# Patient Record
Sex: Female | Born: 1995 | Race: White | Hispanic: No | Marital: Single | Smoking: Current every day smoker
Health system: Southern US, Community
[De-identification: ages and names within clinical notes are randomized; demographics above are authoritative.]

## PROBLEM LIST (undated history)

## (undated) DIAGNOSIS — F988 Other specified behavioral and emotional disorders with onset usually occurring in childhood and adolescence: Secondary | ICD-10-CM

---
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
## 2017-05-26 ENCOUNTER — Other Ambulatory Visit: Payer: Self-pay

## 2017-05-26 ENCOUNTER — Encounter (HOSPITAL_COMMUNITY): Payer: Self-pay | Admitting: Emergency Medicine

## 2017-05-26 ENCOUNTER — Emergency Department (HOSPITAL_COMMUNITY)
Admission: EM | Admit: 2017-05-26 | Discharge: 2017-05-26 | Disposition: A | Discharge status: Home / Self Care | Payer: Medicaid Other | Attending: Emergency Medicine | Admitting: Emergency Medicine

## 2017-05-26 DIAGNOSIS — J028 Acute pharyngitis due to other specified organisms: Secondary | ICD-10-CM

## 2017-05-26 DIAGNOSIS — B9789 Other viral agents as the cause of diseases classified elsewhere: Secondary | ICD-10-CM | POA: Diagnosis not present

## 2017-05-26 DIAGNOSIS — J029 Acute pharyngitis, unspecified: Secondary | ICD-10-CM | POA: Diagnosis present

## 2017-05-26 DIAGNOSIS — Z79899 Other long term (current) drug therapy: Secondary | ICD-10-CM | POA: Diagnosis not present

## 2017-05-26 DIAGNOSIS — F900 Attention-deficit hyperactivity disorder, predominantly inattentive type: Secondary | ICD-10-CM | POA: Insufficient documentation

## 2017-05-26 DIAGNOSIS — F17290 Nicotine dependence, other tobacco product, uncomplicated: Secondary | ICD-10-CM | POA: Diagnosis not present

## 2017-05-26 LAB — GROUP A STREP BY PCR: Group A Strep by PCR: NOT DETECTED

## 2017-05-26 MED ORDER — ACETAMINOPHEN-CODEINE 120-12 MG/5ML PO SOLN: 5.0000 mL | ORAL | 0 refills | Status: AC | PRN

## 2017-05-26 MED ORDER — GUAIFENESIN ER 1200 MG PO TB12: 1.0000 | ORAL_TABLET | Freq: Two times a day (BID) | ORAL | 0 refills | Status: AC

## 2017-05-26 MED ORDER — IBUPROFEN 800 MG PO TABS: 800.0000 mg | ORAL_TABLET | Freq: Three times a day (TID) | ORAL | 0 refills | Status: AC | PRN

## 2017-05-26 NOTE — ED Triage Notes (Signed)
Pt reports sore throat since yesterday. symptoms began as scratching she used warm salt water gargle and antihistamine. NAD.

## 2017-05-26 NOTE — ED Notes (Signed)
Pt verbalized understanding of discharge instructions and denies any further questions at this time.   

## 2017-05-26 NOTE — Discharge Instructions (Addendum)
Return here as needed.  Your strep test is negative.  Increase your fluid intake and rest as much as possible.

## 2017-05-28 NOTE — ED Provider Notes (Signed)
[facility] EMERGENCY DEPARTMENT Provider Note   CSN: [id number] Arrival date & time: 05/26/17  1107     History   Chief Complaint Chief Complaint  Patient presents with  . Sore Throat    HPI Rebekah Ross is a 22 y.o. female.  HPI Patient presents to the emergency department with sore throat and cough over the last 24 hours.  The patient states that she was concerned she may have a bacterial infection in her throat.  The patient states she did gargle with salt water at home.  She states no other medications or therapies were tried.  Patient states nothing seems to make the condition better or worse.  Patient denies chest pain, shortness of breath, nausea, vomiting, weakness, fever, dizziness, head, blurred vision, throat swelling, difficulty swallowing or syncope. Past Medical History:  Diagnosis Date  . ADD (attention deficit disorder)     Patient Active Problem List   Diagnosis Date Noted  . 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 ABDOMINAL PAIN, LEFT LOWER QUADRANT 04/25/2006  . ATTENTION DEFICIT, W/O HYPERACTIVITY 03/09/2006    History reviewed. No pertinent surgical history.   OB History   None      Home Medications    Prior to Admission medications   Medication Sig Start Date End Date Taking? Authorizing Provider  acetaminophen-codeine 120-12 MG/5ML solution Take 5 mLs by mouth every 4 (four) hours as needed for moderate pain. 05/26/17   Muslima Toppins, Cristal Deer, PA-C  escitalopram (LEXAPRO) 10 MG tablet Take 1 tablet at bedtime by mouth. 10/28/16   [provider]  Guaifenesin 1200 MG TB12 Take 1 tablet (1,200 mg total) by mouth 2 (two) times daily. 05/26/17   Nathalia Wismer, Cristal Deer, PA-C  hydrOXYzine (VISTARIL) 25 MG capsule Take 1 capsule at bedtime by mouth. 10/12/16   [provider]  ibuprofen (ADVIL,MOTRIN) 800 MG tablet Take 1 tablet (800 mg total) by mouth every 8 (eight) hours as needed. 05/26/17   Nataya Bastedo, Cristal Deer, PA-C    predniSONE (DELTASONE) 10 MG tablet Take 1 tablet as directed by mouth. 11/15/16   [provider]    Family History No family history on file.  Social History Social History   Tobacco Use  . Smoking status: Current Every Day Smoker    Types: E-cigarettes  . Smokeless tobacco: Never Used  . Tobacco comment: Vapors  Substance Use Topics  . Alcohol use: No  . Drug use: No     Allergies   Patient has no known allergies.   Review of Systems Review of Systems  All other systems negative except as documented in the HPI. All pertinent positives and negatives as reviewed in the HPI. Physical Exam Updated Vital Signs BP 118/81 (BP Location: Right Arm)   Pulse 77   Temp 98.2 F (36.8 C) (Oral)   Resp 20   LMP 05/09/2017   SpO2 100%   Physical Exam  Constitutional: She is oriented to person, place, and time. She appears well-developed and well-nourished. No distress.  HENT:  Head: Normocephalic and atraumatic.  Mouth/Throat: Uvula is midline. Posterior oropharyngeal erythema present. No oropharyngeal exudate or posterior oropharyngeal edema. Tonsils are 1+ on the right. Tonsils are 1+ on the left.  Eyes: Pupils are equal, round, and reactive to light.  Neck: Normal range of motion. Neck supple.  Cardiovascular: Normal rate, regular rhythm and normal heart sounds. Exam reveals no gallop and no friction rub.  No murmur heard. Pulmonary/Chest: Effort normal and breath sounds normal. No respiratory distress. She  has no wheezes.  Abdominal: Soft. Bowel sounds are normal. She exhibits no distension. There is no tenderness.  Neurological: She is alert and oriented to person, place, and time. She exhibits normal muscle tone. Coordination normal.  Skin: Skin is warm and dry. Capillary refill takes less than 2 seconds. No rash noted. No erythema.  Psychiatric: She has a normal mood and affect. Her behavior is normal.  Nursing note and vitals reviewed.    ED Treatments /  Results  Labs (all labs ordered are listed, but only abnormal results are displayed) Labs Reviewed  GROUP A STREP BY PCR    EKG None  Radiology No results found.  Procedures Procedures (including critical care time)  Medications Ordered in ED Medications - No data to display   Initial Impression / Assessment and Plan / ED Course  I have reviewed the triage vital signs and the nursing notes.  Pertinent labs & imaging results that were available during my care of the patient were reviewed by me and considered in my medical decision making (see chart for details).     Will be treated for viral URI with cough and pharyngitis.  Told return here as needed.  Told to increase her fluid intake and rest as much as possible.  Final Clinical Impressions(s) / ED Diagnoses   Final diagnoses:  Pharyngitis due to other organism    ED Discharge Orders        Ordered    ibuprofen (ADVIL,MOTRIN) 800 MG tablet  Every 8 hours PRN     05/26/17 1342    Guaifenesin 1200 MG TB12  2 times daily     05/26/17 1342    acetaminophen-codeine 120-12 MG/5ML solution  Every 4 hours PRN     05/26/17 1342       Hether Anselmo, [place], PA-C 05/28/17 1635    Shaune Pollack, MD 05/28/17 1815

## 2019-06-26 ENCOUNTER — Emergency Department (HOSPITAL_COMMUNITY)
Admission: EM | Admit: 2019-06-26 | Discharge: 2019-06-26 | Disposition: A | Discharge status: Home / Self Care | Payer: Self-pay | Attending: Emergency Medicine | Admitting: Emergency Medicine

## 2019-06-26 ENCOUNTER — Other Ambulatory Visit: Payer: Self-pay

## 2019-06-26 ENCOUNTER — Emergency Department (HOSPITAL_COMMUNITY): Payer: Self-pay

## 2019-06-26 ENCOUNTER — Encounter (HOSPITAL_COMMUNITY): Payer: Self-pay

## 2019-06-26 DIAGNOSIS — R1011 Right upper quadrant pain: Secondary | ICD-10-CM | POA: Insufficient documentation

## 2019-06-26 DIAGNOSIS — R110 Nausea: Secondary | ICD-10-CM | POA: Insufficient documentation

## 2019-06-26 DIAGNOSIS — R630 Anorexia: Secondary | ICD-10-CM | POA: Insufficient documentation

## 2019-06-26 DIAGNOSIS — F909 Attention-deficit hyperactivity disorder, unspecified type: Secondary | ICD-10-CM | POA: Insufficient documentation

## 2019-06-26 DIAGNOSIS — F17200 Nicotine dependence, unspecified, uncomplicated: Secondary | ICD-10-CM | POA: Insufficient documentation

## 2019-06-26 DIAGNOSIS — Z79899 Other long term (current) drug therapy: Secondary | ICD-10-CM | POA: Insufficient documentation

## 2019-06-26 DIAGNOSIS — R1013 Epigastric pain: Secondary | ICD-10-CM | POA: Insufficient documentation

## 2019-06-26 LAB — COMPREHENSIVE METABOLIC PANEL
ALT: 14 U/L (ref 0–44)
AST: 19 U/L (ref 15–41)
Albumin: 3.9 g/dL (ref 3.5–5.0)
Alkaline Phosphatase: 71 U/L (ref 38–126)
Anion gap: 7 (ref 5–15)
BUN: 11 mg/dL (ref 6–20)
CO2: 26 mmol/L (ref 22–32)
Calcium: 9.2 mg/dL (ref 8.9–10.3)
Chloride: 103 mmol/L (ref 98–111)
Creatinine, Ser: 0.82 mg/dL (ref 0.44–1.00)
GFR calc Af Amer: >60 mL/min (ref >60)
GFR calc non Af Amer: >60 mL/min (ref >60)
Glucose, Bld: 112 mg/dL — ABNORMAL HIGH (ref 70–99)
Potassium: 3.7 mmol/L (ref 3.5–5.1)
Sodium: 136 mmol/L (ref 135–145)
Total Bilirubin: 0.5 mg/dL (ref 0.3–1.2)
Total Protein: 6.8 g/dL (ref 6.5–8.1)

## 2019-06-26 LAB — URINALYSIS, ROUTINE W REFLEX MICROSCOPIC
Bilirubin Urine: NEGATIVE
Glucose, UA: NEGATIVE mg/dL
Hgb urine dipstick: NEGATIVE
Ketones, ur: NEGATIVE mg/dL
Leukocytes,Ua: NEGATIVE
Nitrite: NEGATIVE
Protein, ur: NEGATIVE mg/dL
Specific Gravity, Urine: 1.019 (ref 1.005–1.030)
pH: 8 (ref 5.0–8.0)

## 2019-06-26 LAB — I-STAT BETA HCG BLOOD, ED (MC, WL, AP ONLY): I-stat hCG, quantitative: <5 m[IU]/mL (ref <5)

## 2019-06-26 LAB — CBC
HCT: 37.8 % (ref 36.0–46.0)
Hemoglobin: 12.6 g/dL (ref 12.0–15.0)
MCH: 30.1 pg (ref 26.0–34.0)
MCHC: 33.3 g/dL (ref 30.0–36.0)
MCV: 90.4 fL (ref 80.0–100.0)
Platelets: 360 10*3/uL (ref 150–400)
RBC: 4.18 MIL/uL (ref 3.87–5.11)
RDW: 12.5 % (ref 11.5–15.5)
WBC: 11.7 10*3/uL — ABNORMAL HIGH (ref 4.0–10.5)
nRBC: 0 % (ref 0.0–0.2)

## 2019-06-26 LAB — LIPASE, BLOOD: Lipase: 25 U/L (ref 11–51)

## 2019-06-26 MED ORDER — OMEPRAZOLE 20 MG PO CPDR
20.0000 mg | DELAYED_RELEASE_CAPSULE | Freq: Two times a day (BID) | ORAL | 0 refills | Status: AC
Start: 2019-06-26 — End: ?

## 2019-06-26 MED ORDER — SUCRALFATE 1 G PO TABS
1.0000 g | ORAL_TABLET | Freq: Three times a day (TID) | ORAL | 0 refills | Status: AC | PRN
Start: 2019-06-26 — End: ?

## 2019-06-26 MED ORDER — LIDOCAINE VISCOUS HCL 2 % MT SOLN
15.0000 mL | Freq: Once | OROMUCOSAL | Status: AC
  Administered 2019-06-26: 15 mL via ORAL
  Filled 2019-06-26: qty 15

## 2019-06-26 MED ORDER — SODIUM CHLORIDE 0.9% FLUSH: 3.0000 mL | Freq: Once | INTRAVENOUS | Status: DC

## 2019-06-26 MED ORDER — ALUM & MAG HYDROXIDE-SIMETH 200-200-20 MG/5ML PO SUSP
30.0000 mL | Freq: Once | ORAL | Status: AC
  Administered 2019-06-26: 30 mL via ORAL
  Filled 2019-06-26: qty 30

## 2019-06-26 MED ORDER — ONDANSETRON 4 MG PO TBDP
4.0000 mg | ORAL_TABLET | Freq: Once | ORAL | Status: AC
  Administered 2019-06-26: 4 mg via ORAL
  Filled 2019-06-26: qty 1

## 2019-06-26 MED ORDER — ONDANSETRON 4 MG PO TBDP: 4.0000 mg | ORAL_TABLET | Freq: Three times a day (TID) | ORAL | 0 refills | Status: AC | PRN

## 2019-06-26 NOTE — ED Triage Notes (Signed)
Patient complains of epigastric pain with radiation to back that started last week, resolved and than returned. Describes as fullness, nausea with no vomiting. Denies urinary symptoms

## 2019-06-26 NOTE — ED Notes (Signed)
Patient verbalizes understanding of discharge instructions. Opportunity for questioning and answers were provided. Armband removed by staff, pt discharged from ED and ambulated to lobby to leave with family.   

## 2019-06-26 NOTE — ED Provider Notes (Signed)
[facility] EMERGENCY DEPARTMENT Provider Note   CSN: [id number] Arrival date & time: 06/26/19  1533     History No chief complaint on file.   Rebekah Ross is a 24 y.o. female with no relevant past medical history presents to the ED with complaints of RUQ/epigastric pain that woke her from her sleep at 6 AM and has been constant throughout the day with associated nausea and diminished appetite.  She states that she had a similar episode approximately 1 week ago that ultimately resolved spontaneously.  She describes this discomfort as a "aching" 8 out of 10 pain.  Patient also feels as though it radiates to her right subscapular region.  She does take famotidine occasionally for reflux disease, but this does not feel like her typical burning discomfort.  She is accompanied by her mother who states that patient's father had a cholecystectomy in his 75s and that is their primary concern.  She states that she has had difficulty tolerating any food or liquid by mouth today and has also endorsed intermittent chills.  She also reports that she smokes vape cigarettes and endorses alcohol use.  No significant NSAID use.  She denies any fevers, chest pain or difficulty breathing, vomiting, melena, hematochezia, or other change in bowel habits, prior history of abdominal surgery, or other symptoms.  Last BM was this morning, normal.  HPI     Past Medical History:  Diagnosis Date  . ADD (attention deficit disorder)     Patient Active Problem List   Diagnosis Date Noted  . 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 ABDOMINAL PAIN, LEFT LOWER QUADRANT 04/25/2006  . ATTENTION DEFICIT, W/O HYPERACTIVITY 03/09/2006    History reviewed. No pertinent surgical history.   OB History   No obstetric history on file.     No family history on file.  Social History   Tobacco Use  . Smoking status: Current Every Day Smoker    Types: E-cigarettes  . Smokeless tobacco: Never Used  .  Tobacco comment: Vapors  Substance Use Topics  . Alcohol use: No  . Drug use: No    Home Medications Prior to Admission medications   Medication Sig Start Date End Date Taking? Authorizing Provider  acetaminophen-codeine 120-12 MG/5ML solution Take 5 mLs by mouth every 4 (four) hours as needed for moderate pain. Patient not taking: Reported on 06/26/2019 05/26/17   Charlestine Night, PA-C  Guaifenesin 1200 MG TB12 Take 1 tablet (1,200 mg total) by mouth 2 (two) times daily. Patient not taking: Reported on 06/26/2019 05/26/17   Charlestine Night, PA-C  ibuprofen (ADVIL,MOTRIN) 800 MG tablet Take 1 tablet (800 mg total) by mouth every 8 (eight) hours as needed. Patient not taking: Reported on 06/26/2019 05/26/17   Charlestine Night, PA-C  omeprazole (PRILOSEC) 20 MG capsule Take 1 capsule (20 mg total) by mouth 2 (two) times daily before a meal. 06/26/19   Chilton Si, Sharion Settler, PA-C  ondansetron (ZOFRAN ODT) 4 MG disintegrating tablet Take 1 tablet (4 mg total) by mouth every 8 (eight) hours as needed for nausea or vomiting. 06/26/19   Lorelee New, PA-C  sucralfate (CARAFATE) 1 g tablet Take 1 tablet (1 g total) by mouth 3 (three) times daily as needed (heartburn, as needed.). 06/26/19   Lorelee New, PA-C    Allergies    Patient has no known allergies.  Review of Systems   Review of Systems  All other systems reviewed and are negative.   Physical Exam Updated Vital  Signs BP 119/81   Pulse 91   Temp (!) 97.5 F (36.4 C) (Oral)   Resp 16   SpO2 100%   Physical Exam Vitals and nursing note reviewed. Exam conducted with a chaperone present.  HENT:     Head: Normocephalic and atraumatic.  Eyes:     General: No scleral icterus.    Conjunctiva/sclera: Conjunctivae normal.  Cardiovascular:     Rate and Rhythm: Normal rate and regular rhythm.     Pulses: Normal pulses.     Heart sounds: Normal heart sounds.  Pulmonary:     Effort: Pulmonary effort is normal. No respiratory  distress.     Breath sounds: Normal breath sounds.  Abdominal:     Comments: Soft, nondistended.  Tenderness in epigastrium and RUQ.  Positive Murphy sign.  No tenderness elsewhere.  No overlying skin changes.  NABS.  Musculoskeletal:     Cervical back: Normal range of motion and neck supple. No rigidity.  Skin:    General: Skin is dry.     Capillary Refill: Capillary refill takes less than 2 seconds.  Neurological:     Mental Status: She is alert and oriented to person, place, and time.     GCS: GCS eye subscore is 4. GCS verbal subscore is 5. GCS motor subscore is 6.  Psychiatric:        Mood and Affect: Mood normal.        Behavior: Behavior normal.        Thought Content: Thought content normal.     ED Results / Procedures / Treatments   Labs (all labs ordered are listed, but only abnormal results are displayed) Labs Reviewed  COMPREHENSIVE METABOLIC PANEL - Abnormal; Notable for the following components:      Result Value   Glucose, Bld 112 (*)    All other components within normal limits  CBC - Abnormal; Notable for the following components:   WBC 11.7 (*)    All other components within normal limits  URINALYSIS, ROUTINE W REFLEX MICROSCOPIC - Abnormal; Notable for the following components:   APPearance TURBID (*)    Bacteria, UA FEW (*)    All other components within normal limits  LIPASE, BLOOD  I-STAT BETA HCG BLOOD, ED ([facility], [facility], [facility] ONLY)    EKG None  Radiology US Abdomen Limited  Result Date: 06/26/2019 CLINICAL DATA:  Right upper quadrant pain for 1 week. EXAM: ULTRASOUND ABDOMEN LIMITED RIGHT UPPER QUADRANT COMPARISON:  None. FINDINGS: Gallbladder: No gallstones or wall thickening visualized. No sonographic Murphy sign noted by sonographer. Common bile duct: Diameter: 3 mm Liver: No focal lesion identified. Within normal limits in parenchymal echogenicity. Portal vein is patent on color Doppler imaging with normal direction of blood flow towards the liver.  Other: None. IMPRESSION: Negative right upper quadrant ultrasound. Electronically Signed   By: Sebastian Ache M.D.   On: 06/26/2019 21:49    Procedures Procedures (including critical care time)  Medications Ordered in ED Medications  sodium chloride flush (NS) 0.9 % injection 3 mL (has no administration in time range)  alum & mag hydroxide-simeth (MAALOX/MYLANTA) 200-200-20 MG/5ML suspension 30 mL (30 mLs Oral Given 06/26/19 2131)    And  lidocaine (XYLOCAINE) 2 % viscous mouth solution 15 mL (15 mLs Oral Given 06/26/19 2131)  ondansetron (ZOFRAN-ODT) disintegrating tablet 4 mg (4 mg Oral Given 06/26/19 2131)    ED Course  I have reviewed the triage vital signs and the nursing notes.  Pertinent labs & imaging  results that were available during my care of the patient were reviewed by me and considered in my medical decision making (see chart for details).    MDM Rules/Calculators/A&P                          Patient's history of physical exam is concerning for possible gallbladder etiology.  Will obtain limited ultrasound of right upper quadrant.  Providing patient with a GI cocktail here in the ED to see if it helps improve her symptoms of epigastric abdominal discomfort.  Lower suspicion for active PUD has patient denies any melena.  Her tenderness is most readily appreciated in RUQ.  While she does endorse tobacco and alcohol use, she denies any significant NSAID use.  Patient's laboratory work-up is entirely unremarkable aside from mildly elevated leukocytosis 11.7.  Obtained US abdomen Limited of right upper quadrant which reveals no gallbladder wall thickening, gallstones, or other acute findings.  Negative exam.  On reexamination, patient feels mildly improved with the Maalox and her nausea symptoms have improved entirely with Zofran ODT.  She is eating and drinking without difficulty.  Will discharge home with instructions to take 40 mg omeprazole p.o. daily for the next 6 weeks and to  get established with a primary care provider for ongoing evaluation and management.  Also encouraging her to take Carafate as needed for symptoms discomfort.  This patient presents with abdominal pain of unclear etiology. Their evaluation has not identified a emergent etiology for the abdominal pain. Specifically, given the very benign exam, normal laboratory studies, and lack of significant risk factors, I have a very low suspicion for appendicitis, ischemic bowel, bowel perforation, or any other life threatening disease. I have discussed with the patient the level of uncertainty with undifferentiated abdominal pain and clearly explained the need to follow-up as noted on the discharge instructions, or return to the Emergency Department immediately if the pain worsens, develops fever, persistent and uncontrollable vomiting, or for any new symptoms or concerns. I discussed with the patient that this presentation today for abdominal pain could represent a significant risk for an acute abdominal process. Although the tests in the ED were essentially normal, there is still a possibility of a process such as appendicitis, diverticulitis, cholecystitis, ulcer, early bowel obstruction, mesenteric ischemia, kidney stone, or even kidney infection which could subsequently cause disability or death. The patient understands that they must return within 24 hours for a recheck or see their physician within 24 hours for re-exam due to the possibility of significant surgical or medical process.  Strict ED return precautions discussed.  All of the evaluation and work-up results were discussed with the patient and any family at bedside. They were provided opportunity to ask any additional questions and have none at this time. They have expressed understanding of verbal discharge instructions as well as return precautions and are agreeable to the plan.    Final Clinical Impression(s) / ED Diagnoses Final diagnoses:    Epigastric pain    Rx / DC Orders ED Discharge Orders         Ordered    omeprazole (PRILOSEC) 20 MG capsule  2 times daily before meals     Discontinue  Reprint     06/26/19 2239    sucralfate (CARAFATE) 1 g tablet  3 times daily PRN     Discontinue  Reprint     06/26/19 2239    ondansetron (ZOFRAN ODT) 4 MG disintegrating tablet  Every 8 hours PRN     Discontinue  Reprint     06/26/19 2239           Lorelee New, PA-C 06/26/19 2239    Sabas Sous, MD 06/26/19 2337

## 2019-06-26 NOTE — Discharge Instructions (Signed)
Please get established with a primary care provider for ongoing evaluation management of your health and wellbeing.  Please take your medications, as directed.  The omeprazole should be taken each day and the carafate should be taken as needed for discomfort.  Please discontinue vaping and limit your alcohol consumption.    You will need to follow-up with gastroenterology should your symptoms fail to improve with conservative therapies.  Return to the ED or seek immediate medical attention should experience any new or worsening symptoms.

## 2022-03-26 IMAGING — US US ABDOMEN LIMITED
1 series · 14 of 25 positions shown · non-contrast
Comparison: None.

CLINICAL DATA: Right upper quadrant pain for 1 week.

EXAM:
ULTRASOUND ABDOMEN LIMITED RIGHT UPPER QUADRANT

[Series 1: us abdomen limited · 14 of 43 slices shown]
[im 1/43]
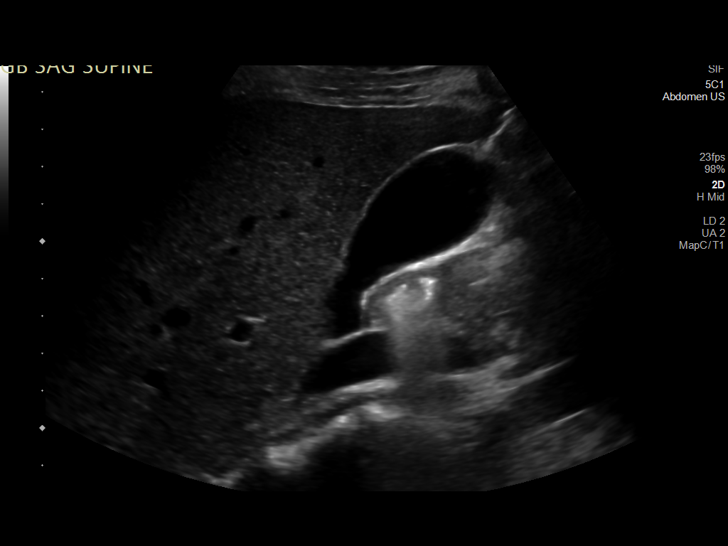
[im 4/43]
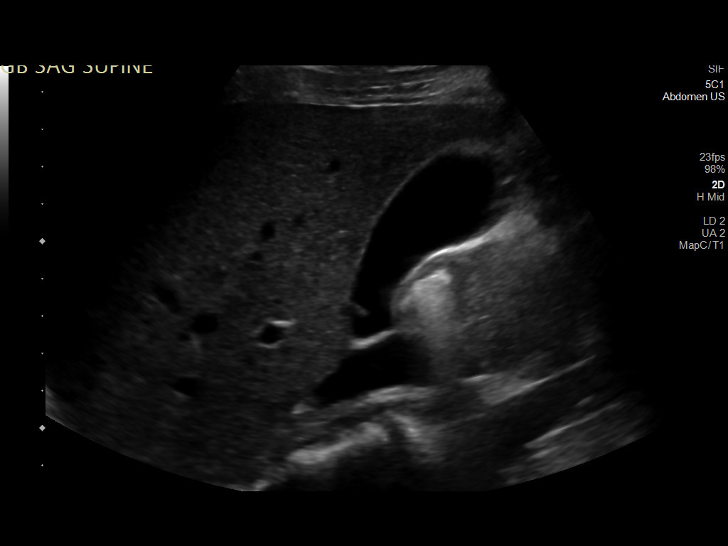
[im 8/43]
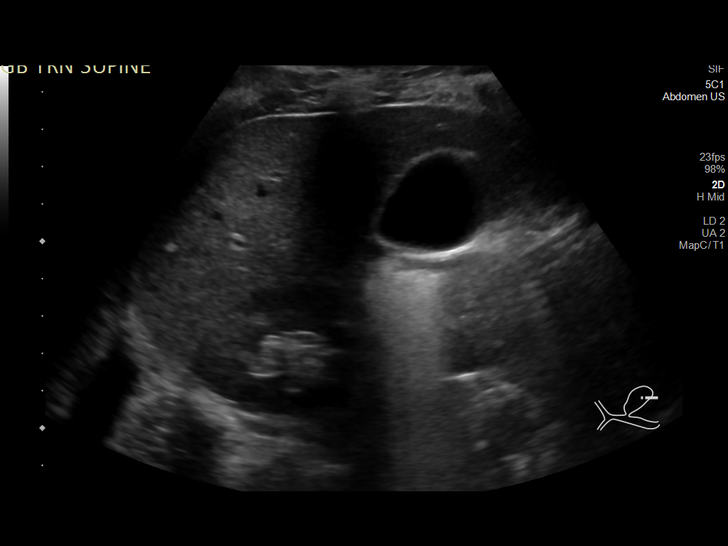
[im 11/43]
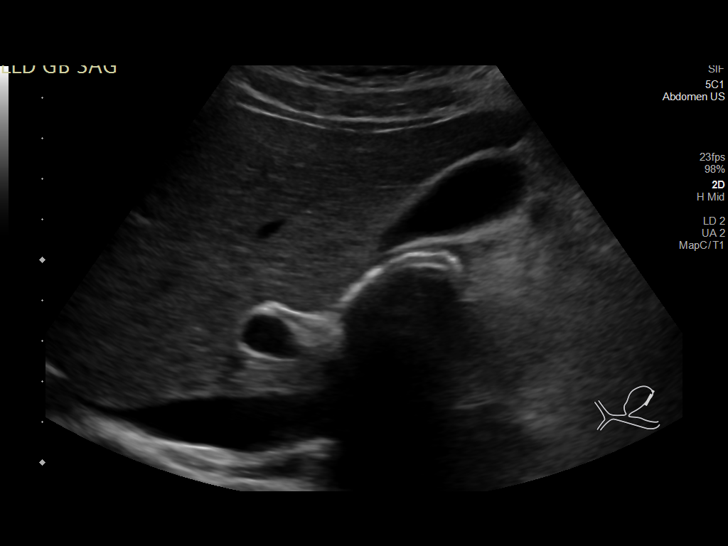
[im 15/43]
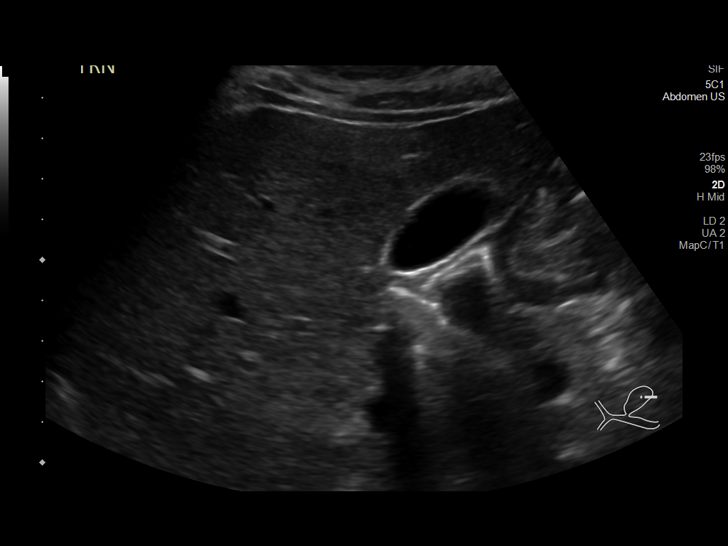
[im 16/43]
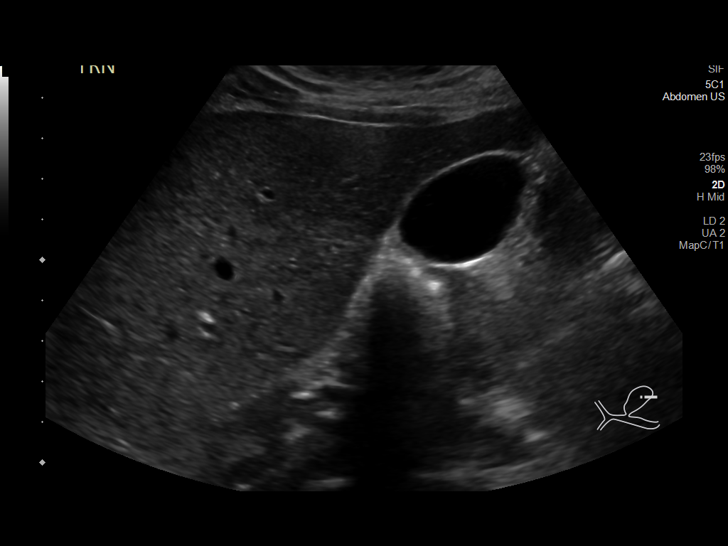
[im 20/43]
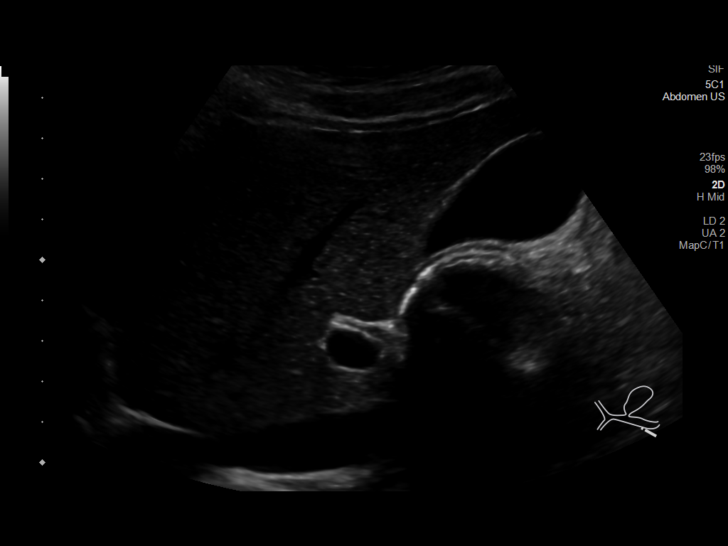
[im 23/43]
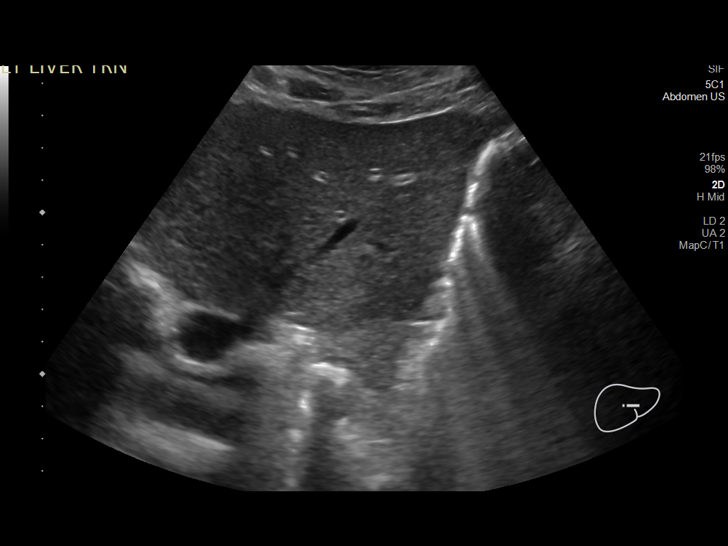
[im 27/43]
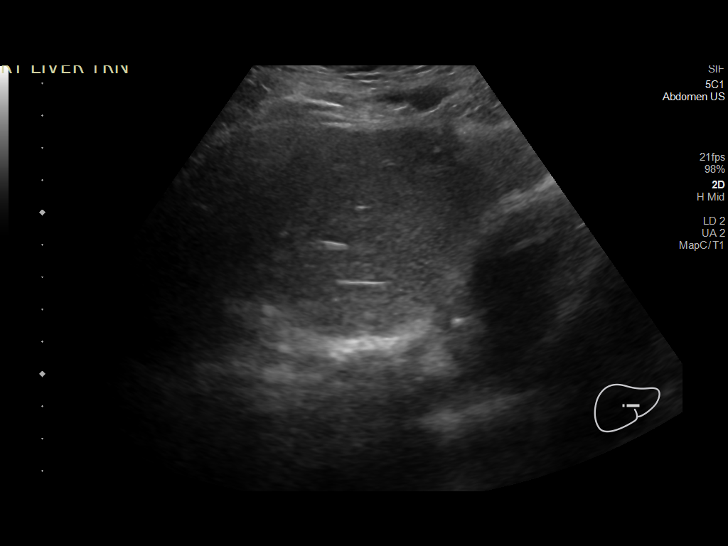
[im 29/43]
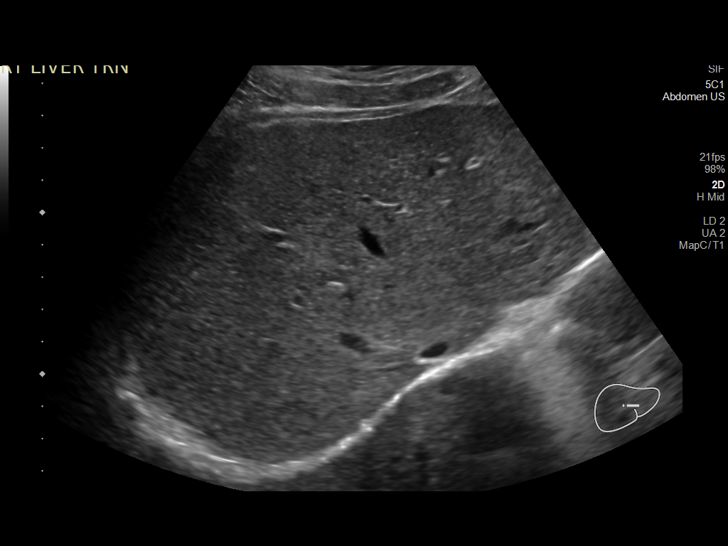
[im 32/43]
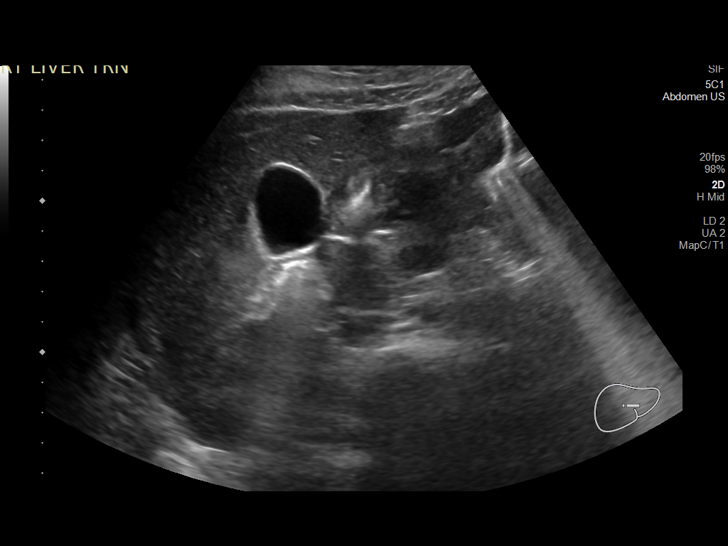
[im 36/43]
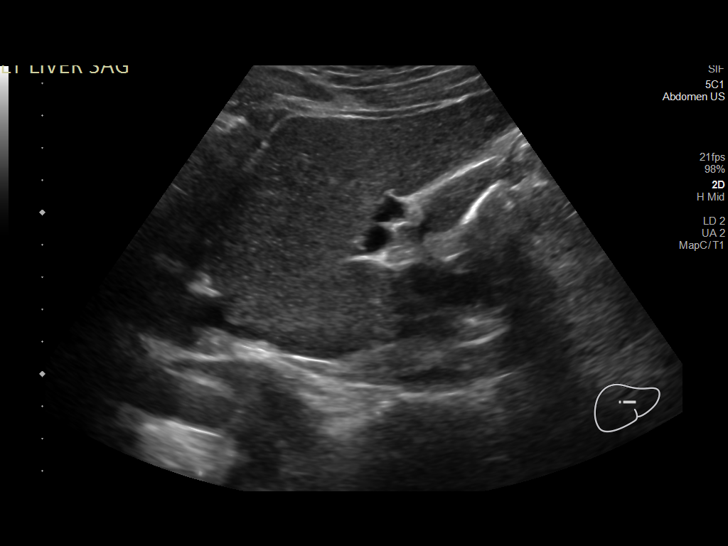
[im 39/43]
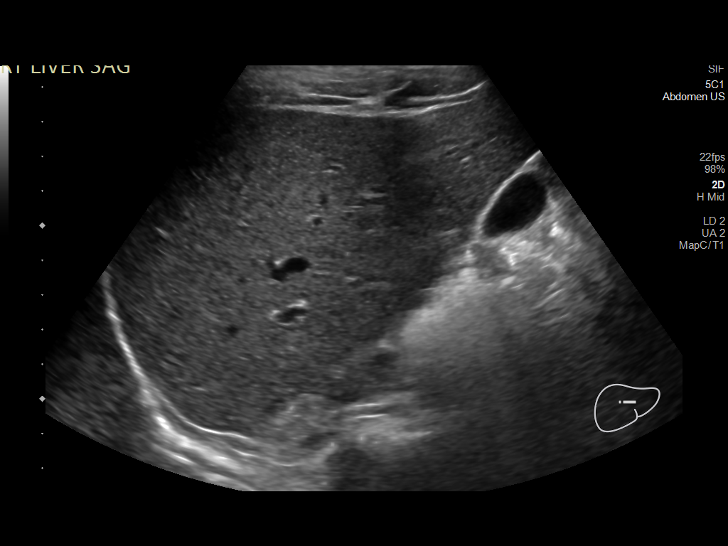
[im 43/43]
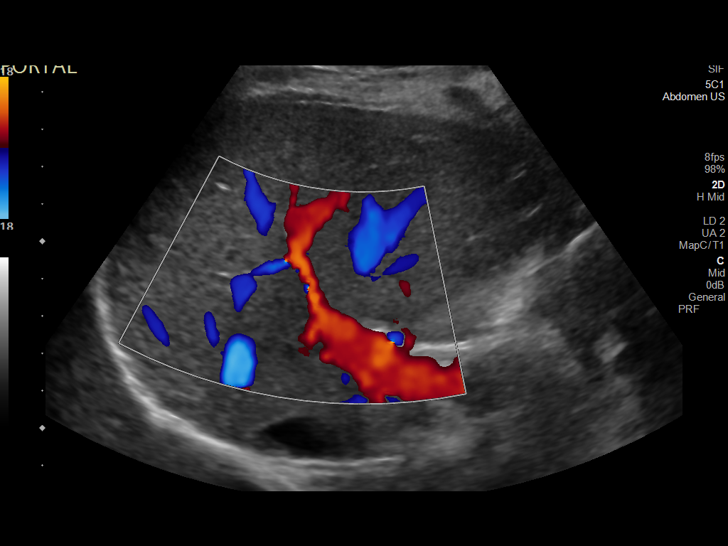

[14 of 25 positions shown; findings below may reference images not displayed]

FINDINGS: Gallbladder:

No gallstones or wall thickening visualized. No sonographic Murphy
sign noted by sonographer.

Common bile duct:

Diameter: 3 mm

Liver:

No focal lesion identified. Within normal limits in parenchymal
echogenicity. Portal vein is patent on color Doppler imaging with
normal direction of blood flow towards the liver.

Other: None.
IMPRESSION: Negative right upper quadrant ultrasound.
